# Patient Record
Sex: Male | Born: 2005 | Race: Black or African American | Hispanic: No | Marital: Single | Smoking: Never smoker
Health system: Southern US, Community
[De-identification: ages and names within clinical notes are randomized; demographics above are authoritative.]

## PROBLEM LIST (undated history)

## (undated) DIAGNOSIS — F79 Unspecified intellectual disabilities: Secondary | ICD-10-CM

## (undated) DIAGNOSIS — R6250 Unspecified lack of expected normal physiological development in childhood: Secondary | ICD-10-CM

## (undated) DIAGNOSIS — G809 Cerebral palsy, unspecified: Secondary | ICD-10-CM

## (undated) HISTORY — PX: NO PAST SURGERIES: SHX2092

---
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
## 2023-02-22 ENCOUNTER — Telehealth (INDEPENDENT_AMBULATORY_CARE_PROVIDER_SITE_OTHER): Payer: Self-pay | Admitting: Pediatrics

## 2023-02-22 NOTE — Telephone Encounter (Signed)
  Name of who is calling: [place]ene  Caller's Relationship to Patient: Dad  Best contact number: [phone]  Provider they see: Dr. Rogers Blocker  Reason for call: Patient came in with a paper. The paper stated appointment is not until 05/10/23. Informed patient of the correct date for appointment.     PRESCRIPTION REFILL ONLY  Name of prescription:  Pharmacy:

## 2023-05-06 NOTE — Progress Notes (Signed)
Patient: Daniel Ferrell MRN: [id number] Sex: male DOB: [birth year]  Provider: Lorenz Coaster, MD Location of Care: Pediatric Specialist- Pediatric Complex Care Note type: Routine return visit  History of Present Illness: Referral Source:Daniel Wynetta Emery, MD History from: father and referring office.  Chief Complaint: complex care  Daniel Ferrell is a 17 y.o. 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 Patient was last seen 06/25/22 where I recommended staying off of Keppra and discussed keeping him enrolled at [employer] until 2027.  Since that appointment, patient has had no ED visits or hospitalizations.   Patient presents today with his family who reports the following.   Symptom management:  Father reports improvement in his gait, now able to walk around on his own.  He is now sleeping better, sleeps through the night most of the time.  No reports of behavior problems at school.  Parents report no concerns at home.   No seizures since last appointment.   Care coordination (other providers): Discussed with the family that he is overdue to see his pediatrician.   Care management needs:  He had an in-home equipment eval from PT at [facility]. At that time, they recommended a wheelchair, stroller, and toilet seat. Father reports they have a toilet seat, stroller.  They report they are still waiting on wheelchair.   Equipment needs:  We placed order for elbow splints September 2023, they have received them and are using them today.  He wears them all day during the day to prevent him from sucking his thumb. They have AFOs at school, he doesn't want to wear them outside of school, doesn't think he needs them.     Decision making/Advanced care planning:  Guardianship paperwork completed.  On the innovations waiver. Planning to graduate June 2027.    Past Medical  History Past Medical History:  Diagnosis Date   Cerebral palsy (HCC)    Development delay    Mental retardation     Surgical History Past Surgical History:  Procedure Laterality Date   NO PAST SURGERIES      Family History family history is not on file.   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    He receives therapies at school ST, PT, and OT. No outpatient therapies at this time.    Allergies No Known Allergies  Medications Current Outpatient Medications on File Prior to Visit  Medication Sig Dispense Refill   diazePAM, 20 MG Dose, (VALTOCO 20 MG DOSE) 2 x 10 MG/0.1ML LQPK Place 20 mg into the nose as needed (seizure lasting longer than 5 minutes). 2 each 2   polyethylene glycol powder (GLYCOLAX/MIRALAX) 17 GM/SCOOP powder Take 17 g by mouth daily. (Patient not taking: Reported on 06/25/2022) 500 g 3   No current facility-administered medications on file prior to visit.   The medication list was reviewed and reconciled. All changes or newly prescribed medications were explained.  A complete medication list was provided to the patient/caregiver.  Physical Exam BP 100/62 (BP Location: Left Arm, Patient Position: Sitting, Cuff Size: Normal)   Ht 5' (1.524 m)   Wt 110 lb 3.2 oz (50 kg)   BMI 21.52 kg/m  Weight for age: <1 %ile (Z= -2.47) based on CDC (Boys, 2-20 Years) weight-for-age data using vitals from  05/10/2023.  Length for age: <1 %ile (Z= -3.36) based on CDC (Boys, 2-20 Years) Stature-for-age data based on Stature recorded on 05/10/2023. BMI: Body mass index is 21.52 kg/m. No results found. Gen: well appearing neuroaffected child Skin: No rash, No neurocutaneous stigmata. HEENT: Microcephalic, no dysmorphic features, no conjunctival injection, nares patent, mucous membranes moist, oropharynx clear.  Neck: Supple, no meningismus. No focal  tenderness. Resp: Clear to auscultation bilaterally CV: Regular rate, normal S1/S2, no murmurs, no rubs Abd: BS present, abdomen soft, non-tender, non-distended. No hepatosplenomegaly or mass Ext: Warm and well-perfused. No deformities, no muscle wasting, ROM full.  Neurological Examination: MS: Awake, alert.  Nonverbal, but interactive, reacts appropriately to conversation.   Cranial Nerves: Pupils were equal and reactive to light;  No clear visual field defect, no nystagmus; no ptsosis, face symmetric with full strength of facial muscles, hearing grossly intact, palate elevation is symmetric. Motor-Mild low core and and upper extremities tone.  Mild increased lower extremity tone. Moves extremities at least antigravity. No abnormal movements Reflexes- Reflexes 2+ and symmetric in the biceps, triceps, patellar and achilles tendon. Plantar responses flexor bilaterally, no clonus noted Sensation: Responds to touch in all extremities.  Coordination: Does not reach for objects.  Gait: wide based spastic gait   Diagnosis:  1. Cerebral malaria   2. Static encephalopathy   3. Refugee health exam   4. Intellectual disability   5. Abnormal gait   6. Urinary incontinence, unspecified type      Assessment and Plan Daniel Ferrell is a 17 y.o. 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  Patient seen by case manager, dietician, integrated behavioral health today as well, please see accompanying notes.  I discussed case with all involved parties for coordination of care and recommend patient follow their instructions as below.   Symptom management:  Behaviorally, patient has remained stable. Recommend the family continue to encourage developmental progression with standing and walking. No concerns for seizure, will continue off of Keppra.   Care  coordination: - Recommended the family call to schedule a follow up with Dr. Wynetta Ferrell.   Care management needs:  - Parents report patient not able to tolerate AFOs when walking at home. Plan to reach out to school PT to ask if he is tolerating his AFO's while there to best give advice to the family.  - Referred to managed care coordination to assist with ensuring the family attends all needed appointments and help enrol Marchel in the new [employer] plan.   Equipment needs:  - Plan for [place] to follow up on orders for a wheelchair.  - Recommend continuing to use elbow splints.  - Due to patient's medical condition, patient is indefinitely incontinent of stool and urine.  It is medically necessary for them to use diapers, underpads, and gloves to assist with hygiene and skin integrity.  They require a frequency of up to 200 a month.   Decision making/Advanced care planning: - Father has obtained guardianship of [place].   The CARE PLAN for reviewed and revised to represent the changes above.  This is available in Epic under snapshot, and a physical binder provided to the patient, that can be used for anyone providing care for the patient.   I spent 60 minutes on day of service on this patient including review of chart, discussion with patient and family, discussion of screening results, coordination with other  providers and management of orders and paperwork.     Return in about 4 months (around 09/10/2023).  I, Mayra Reel, scribed for and in the presence of Lorenz Coaster, MD at today's visit on 05/10/2023.   I, Lorenz Coaster MD MPH, personally performed the services described in this documentation, as scribed by Mayra Reel in my presence on 05/10/2023 and it is accurate, complete, and reviewed by me.    Lorenz Coaster MD MPH Neurology,  Neurodevelopment and Neuropalliative care [facility] Child Neurology  [address] [place], [place], [place] [address] Phone: [phone] Fax:  [phone]

## 2023-05-10 ENCOUNTER — Ambulatory Visit (INDEPENDENT_AMBULATORY_CARE_PROVIDER_SITE_OTHER): Payer: Medicaid Other | Admitting: Pediatrics

## 2023-05-10 ENCOUNTER — Encounter (INDEPENDENT_AMBULATORY_CARE_PROVIDER_SITE_OTHER): Payer: Self-pay | Admitting: Pediatrics

## 2023-05-10 VITALS — BP 100/62 | Ht 60.0 in | Wt 110.2 lb

## 2023-05-10 DIAGNOSIS — F79 Unspecified intellectual disabilities: Secondary | ICD-10-CM

## 2023-05-10 DIAGNOSIS — R269 Unspecified abnormalities of gait and mobility: Secondary | ICD-10-CM | POA: Diagnosis not present

## 2023-05-10 DIAGNOSIS — G9349 Other encephalopathy: Secondary | ICD-10-CM | POA: Diagnosis not present

## 2023-05-10 DIAGNOSIS — R32 Unspecified urinary incontinence: Secondary | ICD-10-CM

## 2023-05-10 DIAGNOSIS — B500 Plasmodium falciparum malaria with cerebral complications: Secondary | ICD-10-CM | POA: Diagnosis not present

## 2023-05-10 DIAGNOSIS — Z0289 Encounter for other administrative examinations: Secondary | ICD-10-CM

## 2023-05-10 NOTE — Patient Instructions (Addendum)
I will call the physical therapist to talk about his wheelchair I will talk to school about the braces.   Please bring guardianship paperwork to next appointment.   Please call Dr Wynetta Emery for appointment.  I have sent an order for diapers to [employer].  You can call [phone] if you don't receive them.      [name] therapiste physique kugirango mvuge kubyerekeye igare rye  [name] nishuri kubyerekeye imirongo.  Nyamuneka uzane impapuro zo [name].  Nyamuneka hamagara Dr Wynetta Emery Daniel Ferrell Daniel Ferrell.  Nohereje itegeko ryimpapuro muri [employer]. Daniel Ferrell [phone] niba utakiriye.

## 2023-05-11 ENCOUNTER — Encounter (INDEPENDENT_AMBULATORY_CARE_PROVIDER_SITE_OTHER): Payer: Self-pay

## 2023-05-14 ENCOUNTER — Telehealth (INDEPENDENT_AMBULATORY_CARE_PROVIDER_SITE_OTHER): Payer: Self-pay | Admitting: Pediatrics

## 2023-05-14 NOTE — Telephone Encounter (Signed)
Called [employer] about the order for his wheelchair -  They delivered an adaptive stroller to the family January 2024. That's what the family chose when they started working on it from the order from August.

## 2023-05-14 NOTE — Telephone Encounter (Signed)
Call to brother and father, LVM with each, explaining the situation. Informed them, they can only have either a stroller or wheelchair, and since stroller was delivered so recently, we cannot get them a wheelchair.

## 2023-05-24 ENCOUNTER — Encounter (INDEPENDENT_AMBULATORY_CARE_PROVIDER_SITE_OTHER): Payer: Self-pay | Admitting: Pediatrics

## 2023-08-18 NOTE — Progress Notes (Signed)
Patient: Daniel Ferrell MRN: [id number] Sex: male DOB: [birth year]  Provider: Lorenz Coaster, MD Location of Care: Pediatric Specialist- Pediatric Complex Care Note type: Routine return visit  History of Present Illness: Referral Source:Shruti Wynetta Emery, MD History from: father and referring office.  Chief Complaint: complex care  Daniel Ferrell is a 17 y.o. 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 Patient was last seen on 05/10/2023 where I recommended the patient continue off of Keppra and recommended follow-up with Dr. Wynetta Emery.  Since that appointment, patient has not been seen in the ED or been hospitalized.     Patient presents today with father who reports the following:   Symptom management:  No seizures since last appointment. Walking overall improved. Now able to walk with minimal support. They do use stroller for long distances.  Sleep and behavior are fine.   Care coordination (other providers): At the last visit, it was recommended that the patient follow-up with Dr. Wynetta Emery but no appointment has been scheduled.   Care management needs:  At the last visit, it was recommended that the family reach out to the school PT to discuss if Denon tolerates AFOs while at school.  Continuing at [employer], with OT, PT, Speech.    At the last visit, the patient was referred to [facility] case management. They made multiple attempts to contact the family that went unanswered so they closed the referral.   No dentist lately.    Equipment needs:  At the last appointment, we were following up on wheelchair orders, but the family is unable to receive a wheelchair because they recently got a stroller.  Stroller working well, use only for long distances.    Past Medical History Past Medical History:  Diagnosis Date   Cerebral palsy (HCC)    Development delay     Mental retardation     Surgical History Past Surgical History:  Procedure Laterality Date   NO PAST SURGERIES      Family History family history is not on file.   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    He receives therapies at school ST, PT, and OT. No outpatient therapies at this time.    Allergies No Known Allergies  Medications Current Outpatient Medications on File Prior to Visit  Medication Sig Dispense Refill   polyethylene glycol powder (GLYCOLAX/MIRALAX) 17 GM/SCOOP powder Take 17 g by mouth daily. (Patient not taking: Reported on 06/25/2022) 500 g 3   No current facility-administered medications on file prior to visit.   The medication list was reviewed and reconciled. All changes or newly prescribed medications were explained.  A complete medication list was provided to the patient/caregiver.  Physical Exam BP 110/74 (BP Location: Left Arm, Patient Position: Sitting, Cuff Size: Normal)   Pulse 84   Ht 4\' 11"  (1.499 m)   Wt 109 lb 3.2 oz (49.5 kg)   BMI 22.06 kg/m  Weight for age: <1 %ile (Z= -2.58) based on CDC (Boys, 2-20 Years) weight-for-age data using data from 08/26/2023.  Length for age: <1 %ile (Z= -3.72) based on CDC (Boys, 2-20 Years) Stature-for-age data based on Stature recorded on 08/26/2023. BMI: Body mass index is 22.06 kg/m. No results found. Gen: well appearing neuroaffected child Skin: No rash, No neurocutaneous stigmata.  HEENT: Normocephalic, no dysmorphic features, no conjunctival injection, nares patent, mucous membranes moist, oropharynx clear.  Neck: Supple, no meningismus. No focal tenderness. Resp: Clear to auscultation bilaterally CV: Regular rate, normal S1/S2, no murmurs, no rubs Abd: BS present, abdomen soft, non-tender, non-distended. No hepatosplenomegaly or mass Ext: Warm and well-perfused. No deformities, no muscle  wasting, ROM full.  Neurological Examination: MS: Awake, alert.  Nonverbal, but interactive, reacts appropriately to conversation.   Cranial Nerves: Pupils were equal and reactive to light;  No clear visual field defect, no nystagmus; no ptsosis, face symmetric with full strength of facial muscles, hearing grossly intact, palate elevation is symmetric. Motor-Fairly normal core tone, increased extremity tone. Moves extremities at least antigravity. No abnormal movements Reflexes- Reflexes 2+ and symmetric in the biceps, triceps, patellar and achilles tendon. Plantar responses flexor bilaterally, no clonus noted Sensation: Responds to touch in all extremities.  Coordination: Does not reach for objects.  Gait: Wide based gait with small stance and locked knees for additional support.   Diagnosis:  1. 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 Non-English speaking family   3. History of partial epilepsy   4. Abnormal gait      Assessment and Plan [name] is a 17 y.o. 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 Symptom management:  Will not continue Valtoco given prolonged seizure freedom off medications.  If he has seizure, bring to emergency room for new seizure work-up. No seizure action plan or medciation needed at school.   .    Care coordination: Patient scheduled with PCP while in visit. Date and time provided to family.  We will help with scheduling a dentist appointment at [facility]. I will call you to follow up about this appointment.   Case management needs:  Will hold on [facility] case management for now Patient will continue at [employer] until 55 birthday.  Discuss at next appointment regarding transition plan.   Equipment needs:  Discussed need for hand brace to ensure  skin integrity and hygiene with bringing hand to mouth.   Tayon should wear the leg braces starting in the morning until night time. He should also wear them while he is at school.  Due to patient's medical condition, patient is indefinitely incontinent of stool and urine.  It is medically necessary for them to use diapers, underpads, and gloves to assist with hygiene and skin integrity.  They require a frequency of up to 200 a month.  The CARE PLAN for reviewed and revised to represent the changes above.  This is available in Epic under snapshot, and a physical binder provided to the patient, that can be used for anyone providing care for the patient.    I spend 46 minutes on day of service on this patient including review of chart, discussion with patient and family, coordination with other providers and management of orders and paperwork.    Return in about 1 year (around 08/25/2024).  Lorenz Coaster MD MPH Neurology,  Neurodevelopment and Neuropalliative care [facility] Child Neurology  [address] [place], [place], [place] [address] Phone: [phone]

## 2023-08-26 ENCOUNTER — Encounter (INDEPENDENT_AMBULATORY_CARE_PROVIDER_SITE_OTHER): Payer: Self-pay | Admitting: Pediatrics

## 2023-08-26 ENCOUNTER — Ambulatory Visit (INDEPENDENT_AMBULATORY_CARE_PROVIDER_SITE_OTHER): Payer: MEDICAID | Admitting: Pediatrics

## 2023-08-26 VITALS — BP 110/74 | HR 84 | Ht 59.0 in | Wt 109.2 lb

## 2023-08-26 DIAGNOSIS — R269 Unspecified abnormalities of gait and mobility: Secondary | ICD-10-CM

## 2023-08-26 DIAGNOSIS — G8113 Spastic hemiplegia affecting right nondominant side: Secondary | ICD-10-CM

## 2023-08-26 DIAGNOSIS — G40209 Localization-related (focal) (partial) symptomatic epilepsy and epileptic syndromes with complex partial seizures, not intractable, without status epilepticus: Secondary | ICD-10-CM

## 2023-08-26 DIAGNOSIS — Z789 Other specified health status: Secondary | ICD-10-CM | POA: Diagnosis not present

## 2023-08-26 NOTE — Patient Instructions (Addendum)
  Daniel Ferrell does not need his emergency seizure medication, Valtoco, at school anymore We will put in a new order for a new hand brace. We will talk with the physical therapist at school to help get this for him. We will also talk with them about his leg braces.  Daniel Ferrell should wear the leg braces starting in the morning until night time. He should also wear them while he is at school.  Daniel Ferrell is scheduled with Dr. Wynetta Emery on October 07, 2023 at 10:00 am We will help with scheduling a dentist appointment at [facility]. I will call you to follow up about this appointment.   [facility]    [phone] [address] [place] [address] No se habla espaol From birth   Calib ntagikeneye imiti yihutirwa, Valtoco, kwishuri  [place] muburyo bushya kumurongo [place]. Tuzavugana numuvuzi wumubiri kwishuri kugirango tumufashe kubona ibi. Tuzaganira nabo kubyerekeye amaguru ye.   Daniel Ferrell imikandara yamaguru guhera mugitondo kugeza nimugoroba. Agomba kandi kuyambara akiri ku ishuri.   Biteganijwe ko kala gratz na Dr. Wynetta Emery ku ya [address] 20[address] saa kumi  Tuzafasha muguteganya [place] yo Artemio Aly muri [facility]. Nzaguhamagarira gukurikirana ibijyanye n'iyi [place].  [facility]    [phone] [address] [place] [address] No se habla espaol From birth

## 2023-09-02 ENCOUNTER — Encounter (INDEPENDENT_AMBULATORY_CARE_PROVIDER_SITE_OTHER): Payer: Self-pay

## 2023-09-13 ENCOUNTER — Encounter (INDEPENDENT_AMBULATORY_CARE_PROVIDER_SITE_OTHER): Payer: Self-pay | Admitting: Pediatrics

## 2023-10-07 ENCOUNTER — Encounter: Payer: Self-pay | Admitting: Pediatrics

## 2023-10-07 ENCOUNTER — Ambulatory Visit: Payer: MEDICAID | Admitting: Pediatrics

## 2023-10-07 VITALS — BP 112/74 | HR 78 | Ht 58.66 in | Wt 108.6 lb

## 2023-10-07 DIAGNOSIS — F79 Unspecified intellectual disabilities: Secondary | ICD-10-CM | POA: Diagnosis not present

## 2023-10-07 DIAGNOSIS — R159 Full incontinence of feces: Secondary | ICD-10-CM

## 2023-10-07 DIAGNOSIS — Z7185 Encounter for immunization safety counseling: Secondary | ICD-10-CM

## 2023-10-07 DIAGNOSIS — G479 Sleep disorder, unspecified: Secondary | ICD-10-CM

## 2023-10-07 DIAGNOSIS — Z68.41 Body mass index (BMI) pediatric, 5th percentile to less than 85th percentile for age: Secondary | ICD-10-CM

## 2023-10-07 DIAGNOSIS — Z23 Encounter for immunization: Secondary | ICD-10-CM

## 2023-10-07 DIAGNOSIS — Z0001 Encounter for general adult medical examination with abnormal findings: Secondary | ICD-10-CM

## 2023-10-07 DIAGNOSIS — R269 Unspecified abnormalities of gait and mobility: Secondary | ICD-10-CM

## 2023-10-07 DIAGNOSIS — R6250 Unspecified lack of expected normal physiological development in childhood: Secondary | ICD-10-CM

## 2023-10-07 MED ORDER — MELATONIN 2.5 MG PO CHEW: 2.0000 | CHEWABLE_TABLET | Freq: Every day | ORAL | 3 refills | Status: AC

## 2023-10-07 NOTE — Patient Instructions (Addendum)
Please contact [employer]  Immigrant/ Refugee [employer] for [employer] [facility]):  [phone]  [employer]:  [phone]    [facility] Access Project [place]):  [phone]  /  [phone]    [employer] [facility]:   [phone]  [facility] Immigrant Legal Assistance Project:  [phone]

## 2023-10-07 NOTE — Progress Notes (Signed)
Adolescent Well Care Visit Daniel Ferrell is a 17 y.o. male who is here for well care.    PCP:  Marijo File, MD   History was provided by the father. In house Kinyarwanda interpretor from languages resources present   Current Issues: Current concerns include: Parents are concerned about financial constraints & asking for assistance with disability. Per parents [name] is not receiving any disability assistance. Mom also has health issues & is unable to work. She cares for Daniel Ferrell. Mom would like to be his personal care giver through medicaid. He has recently been seen in complex care clinic By Dr Artis Flock. He has AFOs for gait abnormality & wears it daily at school & home till bedtime. Also has a right arm splint to prevent biting his hand. But no wheelchair. He is walking independently though with a wider gait. He has a Financial risk analyst but no wheelchair. No seizure activities & antiepileptics has been discontinued.  Nutrition: Nutrition/Eating Behaviors: eats a variety of foods- no issues with chewing Adequate calcium in diet?: milk Supplements/ Vitamins: no  Exercise/ Media: Play any Sports?/ Exercise: walking Screen Time:  > 2 hours  Sleep:  Sleep: -difficulty sleeping at night. Stays awake many nights & is drowsy at school. School lets him nap  Social Screening: Lives with:  parents, sibs Concerns regarding behavior with peers?  No behavior concerns Stressors of note: yes - financial stressors  Education: School Name: [employer], self contained class Gets OT, OT, ST  Will stay in [employer] till he is 20.   RAAPS, PHQ not completed  Screenings: Patient has a dental home: no - never seen a dentist. Complex care clinic has made a referral   Physical Exam:  Vitals:   10/07/23 1011  BP: 112/74  Pulse: 78  SpO2: 97%  Weight: 108 lb 9.6 oz (49.3 kg)  Height: 4' 10.66" (1.49 m)   BP 112/74 (BP Location: Right Arm, Patient Position: Sitting, Cuff Size: Normal)   Pulse 78   Ht 4'  10.66" (1.49 m)   Wt 108 lb 9.6 oz (49.3 kg)   SpO2 97%   BMI 22.19 kg/m  Body mass index: body mass index is 22.19 kg/m. Blood pressure %iles are not available for patients who are 18 years or older.  Hearing Screening (Inadequate exam)    Right ear  Left ear  Comments: PT UNABLE TO COMPLETE  Vision Screening (Inadequate exam)  Comments: PT UNABLE TO COMPLETE    General Appearance:   Awake, cooperative with exam. Walked independentky, wide based gait  HENT: Normocephalic, no obvious abnormality, conjunctiva clear  Mouth:   Normal appearing teeth, no obvious discoloration, dental caries, or dental caps  Neck:   Supple; thyroid: no enlargement, symmetric, no tenderness/mass/nodules  Chest normal  Lungs:   Clear to auscultation bilaterally, normal work of breathing  Heart:   Regular rate and rhythm, S1 and S2 normal, no murmurs;   Abdomen:   Soft, non-tender, no mass, or organomegaly  GU normal male genitals, no testicular masses or hernia  Musculoskeletal:   Increased extremity tine. Right arm in a splint           Lymphatic:   No cervical adenopathy  Skin/Hair/Nails:   Skin warm, dry and intact, no rashes, no bruises or petechiae  Neurologic:   Strength, gait, and coordination normal and age-appropriate     Assessment and Plan:   17 yr old M  for routine health exam Medically complex with history of cerebral malaria resulting in  moderate HIE, developmental delay, seizures and intellectual disability. Now seizure free & off antiepileptics  Continue services at [employer]. Will need transition plan after 19 yrs. Will refer for case management for disability application. Appt scheduled with Franchot Gallo. Parent would also like to apply as personal care service provider for her child & get reimbursed by [employer].   Parent also given list of community resources as well as contacts for [employer] clinic, [facility] & [facility] for help with citizenship paperwork.  Incontinence Due to  patient's medical condition, patient is indefinitely incontinent of stool and urine. It is medically necessary for them to use diapers, underpads, and gloves to assist with hygiene and skin integrity. They require a frequency of up to 200 a month   Sleep disturbance Can trial melatonin up to 5 mg before bedtime.  Gait abnormality Discussed Orthotic bracing with patient and family, patient will functionally benefit. Will benefit from wheelchair if covered by MCD.  Hearing screening result:not examined Vision screening result: not examined  Counseling provided for all of the vaccine components  Orders Placed This Encounter  Procedures   Flu vaccine trivalent PF, 6mos and older(Flulaval,Afluria,Fluarix,Fluzone)     Return in 6 months (on 04/06/2024) for Recheck with Dr Wynetta Emery.Marijo File, MD

## 2023-10-11 ENCOUNTER — Ambulatory Visit: Payer: MEDICAID

## 2023-10-11 DIAGNOSIS — Z09 Encounter for follow-up examination after completed treatment for conditions other than malignant neoplasm: Secondary | ICD-10-CM

## 2023-10-11 NOTE — Progress Notes (Signed)
CASE MANAGEMENT VISIT  Total time:  85  minutes  Type of Service:CASE MANAGEMENT Interpretor:Yes.   Interpretor Name and Language: Daniel Ferrell   Reason for referral Daniel Ferrell was referred for assistance with applying for disability.   Summary of Today's Visit: Met with dad and Daniel Ferrell today. Dad brought documentation showing the court granted guardianship to him and his wife Daniel Ferrell for Daniel Ferrell. He would like to apply for SSI/disability benefits for Daniel Ferrell.  Completed disability form off the website to initiate the process and instructed dad to take it to the social security office and they will continue with next steps. Two way consent signed by dad for the Social Security Administration.  Dad wants informiaton on citizenship for the entire family.   Discussed referral to [facility] Legal Aid to support with citizenship as well as the SSI process. Dad consented to referral. Referral submitted through letters tab in chart, per their referral process.  Plan for Next Visit: None at this time. Dad to call if needed.   Daniel Ferrell [facility] Coordinator

## 2023-12-01 ENCOUNTER — Encounter (INDEPENDENT_AMBULATORY_CARE_PROVIDER_SITE_OTHER): Payer: Self-pay

## 2024-01-25 ENCOUNTER — Telehealth: Payer: Self-pay

## 2024-01-25 NOTE — Telephone Encounter (Signed)
 _X__ [employer] Form received and placed in yellow pod RN basket ____ Form collected by RN and nurse portion complete ____ Form placed in PCP basket in pod ____ Form completed by PCP and collected by front office leadership ____ Form faxed or Parent notified form is ready for pick up at front desk

## 2024-01-26 NOTE — Telephone Encounter (Signed)
 _X__ [employer] Form received and placed in yellow pod RN basket ___X_ Form collected by RN and nurse portion complete ___X_ Form placed in PCP Dr Durenda folder in pod ____ Form completed by PCP and collected by front office leadership ____ Form faxed or Parent notified form is ready for pick up at front desk

## 2024-02-01 NOTE — Telephone Encounter (Signed)
(  Front office use X to signify action taken)  _X__ Forms received by front office leadership team. _X__ Forms faxed to designated location, placed in scan folder/mailed out ___ Copies with MRN made for in person form to be picked up _X__ Copy placed in scan folder for uploading into patients chart ___ Parent notified forms complete, ready for pick up by front office staff _X__ [employer] office staff update encounter and close

## 2024-02-09 ENCOUNTER — Telehealth: Payer: Self-pay

## 2024-02-09 NOTE — Telephone Encounter (Signed)
 ..  _X__ Leretha Pol Form received and placed in yellow pod RN basket ____ Form collected by RN and nurse portion complete ____ Form placed in PCP basket in pod ____ Form completed by PCP and collected by front office leadership ____ Form faxed or Parent notified form is ready for pick up at front desk

## 2024-02-11 NOTE — Telephone Encounter (Signed)
_X__ Leretha Pol Form received and placed in yellow pod RN basket __X__ Form collected by RN and nurse portion complete _X___ Form placed in Dr Lonie Peak basket in pod ____ Form completed by PCP and collected by front office leadership ____ Form faxed or Parent notified form is ready for pick up at front desk

## 2024-02-24 NOTE — Telephone Encounter (Signed)
 Encounter closed -[employer] form found in media signed 01/27/24.

## 2024-02-24 NOTE — Telephone Encounter (Signed)
(  Front office use X to signify action taken)  _X__ Forms received by front office leadership team. _X__ Forms faxed to designated location, placed in scan folder/mailed out ___ Copies with MRN made for in person form to be picked up _X__ Copy placed in scan folder for uploading into patients chart ___ Parent notified forms complete, ready for pick up by front office staff _X__ [employer] office staff update encounter and close

## 2024-03-16 ENCOUNTER — Telehealth (INDEPENDENT_AMBULATORY_CARE_PROVIDER_SITE_OTHER): Payer: Self-pay | Admitting: Pediatrics

## 2024-03-16 NOTE — Telephone Encounter (Signed)
 Received this email from [name]'s PT at school regarding his SMOs:  I believe you had asked a question regarding Daniel Ferrell (DOB November 28, 2006) and SMO because Mom had a question. Hansford does not wear his SMO but on occasion. They do not send them to school either.  As I said, he does better with walking when wearing appropriate sized shoes that have support.  I do not recommend the bracing for his feet/ankles. [facility] othotist concurs with my assessment based on foot structure, walking ability and inconsistency with wearing.  Just thought I would give you an update.  Clydie Braun PT

## 2024-05-25 ENCOUNTER — Telehealth: Payer: Self-pay | Admitting: Pediatrics

## 2024-05-25 NOTE — Telephone Encounter (Signed)
 Good Afternoon, Patients mom came in with her niece - Murlean Armour Musician). She dropped off a medical certification form to be filled out and signed for the patient. Murlean Armour says that we can contact her to let her know when it's ready due to mom not knowing [place]. Annette's number is [phone].   Thanks!

## 2024-05-26 NOTE — Telephone Encounter (Signed)
  _x__ Medical Certification Forms received via Mychart/nurse line printed off by RN __x_ Nurse portion completed __x_ Forms/notes placed in Providers Stuart Ellis) folder for review and signature. ___ Forms completed by Provider and placed in completed Provider folder for office leadership pick up ___Forms completed by Provider and faxed to designated location, encounter closed

## 2024-06-28 ENCOUNTER — Encounter: Payer: Self-pay | Admitting: Pediatrics

## 2024-06-28 ENCOUNTER — Ambulatory Visit (INDEPENDENT_AMBULATORY_CARE_PROVIDER_SITE_OTHER): Payer: MEDICAID | Admitting: Pediatrics

## 2024-06-28 ENCOUNTER — Telehealth: Payer: Self-pay | Admitting: *Deleted

## 2024-06-28 VITALS — Ht 59.45 in | Wt 110.0 lb

## 2024-06-28 DIAGNOSIS — F79 Unspecified intellectual disabilities: Secondary | ICD-10-CM

## 2024-06-28 DIAGNOSIS — Z603 Acculturation difficulty: Secondary | ICD-10-CM | POA: Diagnosis not present

## 2024-06-28 DIAGNOSIS — G9349 Other encephalopathy: Secondary | ICD-10-CM

## 2024-06-28 DIAGNOSIS — G40209 Localization-related (focal) (partial) symptomatic epilepsy and epileptic syndromes with complex partial seizures, not intractable, without status epilepticus: Secondary | ICD-10-CM | POA: Diagnosis not present

## 2024-06-28 NOTE — Telephone Encounter (Signed)
 [facility] Medical Certification forms filled out by Dr Gabriella today and given to parents at office visit. Copy to media to scan.

## 2024-06-28 NOTE — Progress Notes (Signed)
 Subjective:   In house Kinyarwanda  interpretor from languages resources present - Ms Jocelyn Uwase Travus Wijbpdjaj is a 18 y.o. male accompanied by mother & brother presenting to the clinic today for a face to face encounter to complete forms for immigration- Form [phone]. This forms exempts Da[place]on from taking the citizenship test. Family is in the process of applying for citizenship, they have permanent residency. 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 Now seizure free & off antiepileptics. Per mom he has no issues & has been well. He is at [employer] with an IEP in place & will continue til age 27 yrs. He has a right hand splint but not using SMOs. Can walk unassisted at home with a wide gait. No issues with appetite- eats a variety of home cooked foods & eats regular food at school. Mom has quit her job this summer to care for [employer] its causing financial hardships. She is wondering if she can become her personal care service caregiver & get paid by Medicaid as he not have any PCS aid at this time.  Review of Systems  Constitutional:  Negative for activity change, appetite change and fever.       Non verbal  HENT:  Negative for congestion.   Respiratory:  Negative for cough.   Gastrointestinal:  Negative for abdominal pain and vomiting.  Genitourinary:  Negative for difficulty urinating.  Skin:  Negative for rash.  Psychiatric/Behavioral:  Negative for sleep disturbance.        Objective:   Physical Exam Vitals and nursing note reviewed.  Constitutional:      General: He is not in acute distress.    Comments: Non verbal.  HENT:     Head: Normocephalic and atraumatic.     Right Ear: External ear normal.     Left Ear: External ear normal.     Nose: Nose normal.     Mouth/Throat:     Comments: Significant dental plaques Eyes:     General:        Right eye: No discharge.         Left eye: No discharge.     Conjunctiva/sclera: Conjunctivae normal.  Cardiovascular:     Rate and Rhythm: Normal rate and regular rhythm.     Heart sounds: Normal heart sounds.  Pulmonary:     Effort: No respiratory distress.     Breath sounds: No wheezing or rales.  Abdominal:     General: Abdomen is flat.  Musculoskeletal:     Cervical back: Normal range of motion.  Skin:    General: Skin is warm and dry.     Findings: No rash.  Neurological:     General: No focal deficit present.     Comments: Spacticity of upper & lower limbs. 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  He is non verbal & unable to take the citizenship test. With in person interpretor present, form [phone] completed & handed to parent.  Incontinence Due to his intellectual disability he is incontinent & will continue to need incontinent supplies.  Hand splint Continue hand splint to minimize biting of right  hand.  Gait abnormality Had SMOs but not using them. Per PT not planning to continue SMOs due to inconsistent use.  Will refer back to complex care clinic for follow up & case management for personal care services.  Time spent reviewing chart in preparation for visit:  5 minutes Time spent face-to-face with patient: 25 minutes Time spent not face-to-face with patient for documentation and care coordination on date of service: 5 minutes  Return in about 6 months (around 12/29/2024) for Well child with Dr Gabriella.  Arthor Gabriella, MD 06/28/2024 3:45 PM

## 2024-07-10 ENCOUNTER — Telehealth: Payer: Self-pay | Admitting: Pediatrics

## 2024-07-10 NOTE — Telephone Encounter (Signed)
 Good afternoon,  Please give mom or father a call once the medical orders, tube feeding orders, authorization of medication, and medical statement for students with unique mealtime needs forms have been completed and ready for pickup.  Thanks,

## 2024-07-12 NOTE — Telephone Encounter (Signed)
 Medical forms placed in Dr Durenda folder. Some forms may not apply.

## 2024-07-25 NOTE — Telephone Encounter (Signed)
 Completed, no longer in MD folder.

## 2024-11-29 ENCOUNTER — Telehealth: Payer: Self-pay

## 2024-11-29 NOTE — Telephone Encounter (Signed)
°  __x_ Primary Health Choice Forms received via Mychart/nurse line printed off by RN __x_ Nurse portion completed _x__ Forms/notes placed in Providers folder for review and signature.  ___ Forms completed by Provider and placed in completed Provider folder for office leadership pick up ___Forms completed by Provider and faxed to designated location, encounter closed

## 2025-01-11 NOTE — Telephone Encounter (Signed)
 Closing, no further requests.
